# Patient Record
Sex: Male | Born: 1985 | Race: Black or African American | Hispanic: No | Marital: Single | Smoking: Current every day smoker
Health system: Southern US, Community
[De-identification: ages and names within clinical notes are randomized; demographics above are authoritative.]

---
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
## 2005-10-01 ENCOUNTER — Emergency Department (HOSPITAL_COMMUNITY): Admission: EM | Admit: 2005-10-01 | Discharge: 2005-10-02 | Payer: Self-pay | Admitting: Emergency Medicine

## 2005-10-02 ENCOUNTER — Emergency Department (HOSPITAL_COMMUNITY): Admission: EM | Admit: 2005-10-02 | Discharge: 2005-10-02 | Payer: Self-pay | Admitting: Emergency Medicine

## 2006-04-24 ENCOUNTER — Emergency Department (HOSPITAL_COMMUNITY): Admission: EM | Admit: 2006-04-24 | Discharge: 2006-04-25 | Payer: Self-pay | Admitting: Emergency Medicine

## 2013-05-28 ENCOUNTER — Other Ambulatory Visit: Payer: Self-pay | Admitting: Occupational Medicine

## 2013-05-28 ENCOUNTER — Ambulatory Visit: Payer: Self-pay

## 2013-05-28 DIAGNOSIS — R52 Pain, unspecified: Secondary | ICD-10-CM

## 2013-07-10 ENCOUNTER — Encounter (HOSPITAL_BASED_OUTPATIENT_CLINIC_OR_DEPARTMENT_OTHER): Payer: Self-pay | Admitting: *Deleted

## 2013-07-10 ENCOUNTER — Emergency Department (HOSPITAL_BASED_OUTPATIENT_CLINIC_OR_DEPARTMENT_OTHER)
Admission: EM | Admit: 2013-07-10 | Discharge: 2013-07-10 | Disposition: A | Discharge status: Home / Self Care | Payer: Self-pay | Attending: Emergency Medicine | Admitting: Emergency Medicine

## 2013-07-10 DIAGNOSIS — J069 Acute upper respiratory infection, unspecified: Secondary | ICD-10-CM | POA: Insufficient documentation

## 2013-07-10 DIAGNOSIS — F17200 Nicotine dependence, unspecified, uncomplicated: Secondary | ICD-10-CM | POA: Insufficient documentation

## 2013-07-10 MED ORDER — GUAIFENESIN-CODEINE 100-10 MG/5ML PO SOLN: 5.0000 mL | Freq: Three times a day (TID) | ORAL | Status: AC | PRN

## 2013-07-10 NOTE — ED Provider Notes (Signed)
CSN: [id number]     Arrival date & time 07/10/13  0026 History  This chart was scribed for Geoffery Lyons, MD by Quintella Reichert, ED scribe.  This patient was seen in room MH11/MH11 and the patient's care was started at 12:47 AM.   Chief Complaint  Patient presents with  . Nasal Congestion  . Cough    The history is provided by the patient. No language interpreter was used.    HPI Comments: Vincent Gross is a 27 y.o. male who presents to the Emergency Department complaining of 3 days of gradually-worsening mild congestion and rhinorrhea.  Pt states he recently came into contact with a friend with similar symptoms.  His symptoms began as rhinorrhea and nasal congestion and have progressed to include chest congestion.  He is also coughing up a small amount of sputum.  He has attempted to treat symptoms with Nyquil and Alka Seltzer Cold, without relief.  He denies fever, SOB, ear pain, or any other associated symptoms.  Pt is a current every-day smoker.    History reviewed. No pertinent past medical history.  History reviewed. No pertinent past surgical history.  No family history on file.   History  Substance Use Topics  . Smoking status: Current Every Day Smoker    Types: Cigarettes  . Smokeless tobacco: Never Used  . Alcohol Use: 2.5 oz/week    5 drink(s) per week     Review of Systems  HENT: Positive for congestion.   All other systems reviewed and are negative.     Allergies  Review of patient's allergies indicates no known allergies.  Home Medications  No current outpatient prescriptions on file.  BP 148/78  Pulse 85  Temp(Src) 98.5 F (36.9 C) (Oral)  Resp 16  Ht 5\' 10"  (1.778 m)  Wt 180 lb (81.647 kg)  BMI 25.83 kg/m2  SpO2 100%  Physical Exam  Nursing note and vitals reviewed. Constitutional: He is oriented to person, place, and time. He appears well-developed and well-nourished. No distress.  HENT:  Head: Normocephalic and atraumatic.   Mouth/Throat: Oropharynx is clear and moist. No oropharyngeal exudate.  Eyes: Conjunctivae are normal. Right eye exhibits no discharge. Left eye exhibits no discharge.  Neck: Neck supple. No tracheal deviation present.  Cardiovascular: Normal rate, regular rhythm and normal heart sounds.   No murmur heard. Pulmonary/Chest: Effort normal and breath sounds normal. No respiratory distress. He has no wheezes. He has no rales.  Musculoskeletal: Normal range of motion.  Neurological: He is alert and oriented to person, place, and time.  Skin: Skin is warm and dry.  Psychiatric: He has a normal mood and affect. His behavior is normal.    ED Course  Procedures (including critical care time)  DIAGNOSTIC STUDIES: Oxygen Saturation is 100% on room air, normal by my interpretation.    COORDINATION OF CARE: 12:52 AM: Informed pt that symptoms are likely due to self-limited viral infection.  Discussed treatment plan which includes symptomatic relief.  Pt expressed understanding and agreed to plan.   Labs Review Labs Reviewed - No data to display   Imaging Review No results found.  MDM  No diagnosis found. Patient is a 27 year old male presents with complaints of upper respiratory symptoms and one on for 2 days. His presentation and physical findings are consistent with a viral upper respiratory infection. He is a virus that over-the-counter medications, rest, plenty fluids, time are the treatment for this. He was given a prescription for Robitussin with codeine as  he states he has been coughing at night. He is to return if you develop shortness of breath or other problems    I personally performed the services described in this documentation, which was scribed in my presence. The recorded information has been reviewed and is accurate.      Geoffery Lyons, MD 07/10/13 [phone]

## 2013-07-10 NOTE — ED Notes (Signed)
Pt reports chest congestion and runny nose since Saturday- taking nyquil, alkaseltzer cold
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
## 2014-04-26 ENCOUNTER — Emergency Department (HOSPITAL_BASED_OUTPATIENT_CLINIC_OR_DEPARTMENT_OTHER)
Admission: EM | Admit: 2014-04-26 | Discharge: 2014-04-26 | Disposition: A | Discharge status: Home / Self Care | Payer: Self-pay | Attending: Emergency Medicine | Admitting: Emergency Medicine

## 2014-04-26 ENCOUNTER — Encounter (HOSPITAL_BASED_OUTPATIENT_CLINIC_OR_DEPARTMENT_OTHER): Payer: Self-pay | Admitting: Emergency Medicine

## 2014-04-26 DIAGNOSIS — Z79899 Other long term (current) drug therapy: Secondary | ICD-10-CM | POA: Insufficient documentation

## 2014-04-26 DIAGNOSIS — F17200 Nicotine dependence, unspecified, uncomplicated: Secondary | ICD-10-CM | POA: Insufficient documentation

## 2014-04-26 DIAGNOSIS — B86 Scabies: Secondary | ICD-10-CM | POA: Insufficient documentation

## 2014-04-26 MED ORDER — PERMETHRIN 5 % EX CREA: TOPICAL_CREAM | CUTANEOUS | Status: AC

## 2014-04-26 NOTE — ED Provider Notes (Signed)
Medical screening examination/treatment/procedure(s) were performed by non-physician practitioner and as supervising physician I was immediately available for consultation/collaboration.   EKG Interpretation None        Christopher J. Pollina, MD 04/26/14 2106 

## 2014-04-26 NOTE — Discharge Instructions (Signed)

## 2014-04-26 NOTE — ED Provider Notes (Signed)
CSN: [id number]     Arrival date & time 04/26/14  1640 History   First MD Initiated Contact with Patient 04/26/14 1643     Chief Complaint  Patient presents with  . Rash     (Consider location/radiation/quality/duration/timing/severity/associated sxs/prior Treatment) Patient is a 28 y.o. male presenting with rash. The history is provided by the patient. No language interpreter was used.  Rash Location:  Hand Associated symptoms: no fever   Associated symptoms comment:  Itching rash affecting web spaces of fingers and dorsum of hands bilaterally. He reports multiple family members affected and have been treated for scabies.   History reviewed. No pertinent past medical history. History reviewed. No pertinent past surgical history. No family history on file. History  Substance Use Topics  . Smoking status: Current Every Day Smoker    Types: Cigarettes  . Smokeless tobacco: Never Used  . Alcohol Use: 2.5 oz/week    5 drink(s) per week    Review of Systems  Constitutional: Negative for fever.  Skin: Positive for rash.      Allergies  Review of patient's allergies indicates no known allergies.  Home Medications   Prior to Admission medications   Medication Sig Start Date End Date Taking? Authorizing Provider  guaiFENesin-codeine 100-10 MG/5ML syrup Take 5 mLs by mouth 3 (three) times daily as needed for cough. 07/10/13   Geoffery Lyonsouglas Delo, MD   BP 136/77  Pulse 92  Temp(Src) 98.4 F (36.9 C) (Oral)  Resp 20  Wt 187 lb (84.823 kg)  SpO2 99% Physical Exam  Skin:  Maculopapular rash of interphalangeal spaces bilateral hands, extending to dorsum c/w scabies infection.    ED Course  Procedures (including critical care time) Labs Review Labs Reviewed - No data to display  Imaging Review No results found.   EKG Interpretation None      MDM   Final diagnoses:  None    1. Scabies  Permethrin given and instruction including home cleaning.    Arnoldo HookerShari A  Josseline Reddin, PA-C 04/26/14 1711

## 2014-04-26 NOTE — ED Notes (Signed)
Rash for 3 weeks. He has had scabies exposure.

## 2021-08-06 ENCOUNTER — Ambulatory Visit (HOSPITAL_COMMUNITY): Admission: EM | Admit: 2021-08-06 | Discharge: 2021-08-06 | Discharge status: Against Medical Advice | Payer: Self-pay

## 2021-08-06 ENCOUNTER — Other Ambulatory Visit: Payer: Self-pay

## 2021-08-06 DIAGNOSIS — Z5321 Procedure and treatment not carried out due to patient leaving prior to being seen by health care provider: Secondary | ICD-10-CM

## 2021-08-06 NOTE — ED Provider Notes (Signed)
[facility]   Rhys Martini, PA-C 08/06/21 1634

## 2021-10-27 ENCOUNTER — Other Ambulatory Visit: Payer: Self-pay

## 2021-10-27 ENCOUNTER — Encounter (HOSPITAL_COMMUNITY): Payer: Self-pay

## 2021-10-27 ENCOUNTER — Ambulatory Visit (HOSPITAL_COMMUNITY)
Admission: EM | Admit: 2021-10-27 | Discharge: 2021-10-27 | Disposition: A | Discharge status: Home / Self Care | Payer: Self-pay | Attending: Urgent Care | Admitting: Urgent Care

## 2021-10-27 DIAGNOSIS — R1310 Dysphagia, unspecified: Secondary | ICD-10-CM | POA: Insufficient documentation

## 2021-10-27 DIAGNOSIS — Z20818 Contact with and (suspected) exposure to other bacterial communicable diseases: Secondary | ICD-10-CM | POA: Insufficient documentation

## 2021-10-27 DIAGNOSIS — J029 Acute pharyngitis, unspecified: Secondary | ICD-10-CM | POA: Insufficient documentation

## 2021-10-27 LAB — POCT RAPID STREP A, ED / UC: Streptococcus, Group A Screen (Direct): NEGATIVE

## 2021-10-27 MED ORDER — AMOXICILLIN 500 MG PO CAPS: 500.0000 mg | ORAL_CAPSULE | Freq: Two times a day (BID) | ORAL | 0 refills | Status: AC

## 2021-10-27 NOTE — ED Triage Notes (Signed)
Pt presents with sore throat X 2 days. 

## 2021-10-27 NOTE — ED Provider Notes (Signed)
[facility]   MRN: [id number] DOB: 13-May-1986  Subjective:   Vincent Gross is a 36 y.o. male presenting for 2-day history of acute onset persistent and worsening throat pain, fevers, painful swallowing, bad taste in his mouth.  He has had multiple exposures including to his kids and his wife with strep throat.  They tested positive for for this infection and are now on antibiotics.  He has been doing supportive care at home but is not helping.  Denies runny or stuffy nose, cough, chest pain, shortness of breath.  Patient is a smoker.  No current facility-administered medications for this encounter.  Current Outpatient Medications:    guaiFENesin-codeine 100-10 MG/5ML syrup, Take 5 mLs by mouth 3 (three) times daily as needed for cough., Disp: 120 mL, Rfl: 0   permethrin (ELIMITE) 5 % cream, Apply from neck down to affected area at night x 1 - then wash off in the morning., Disp: 60 g, Rfl: 0   No Known Allergies  History reviewed. No pertinent past medical history.   History reviewed. No pertinent surgical history.  Family History  Family history unknown: Yes    Social History   Tobacco Use   Smoking status: Every Day    Types: Cigarettes   Smokeless tobacco: Never  Substance Use Topics   Alcohol use: Yes    Alcohol/week: 5.0 standard drinks    Types: 5 drink(s) per week   Drug use: No    ROS   Objective:   Vitals: BP 128/74 (BP Location: Left Arm)    Pulse 98    Temp 98.4 F (36.9 C) (Oral)    Resp 18    SpO2 98%   Physical Exam Constitutional:      General: He is not in acute distress.    Appearance: Normal appearance. He is well-developed and normal weight. He is not ill-appearing, toxic-appearing or diaphoretic.  HENT:     Head: Normocephalic and atraumatic.     Right Ear: External ear normal.     Left Ear: External ear normal.     Nose: Nose normal.     Mouth/Throat:     Pharynx: Pharyngeal swelling and posterior oropharyngeal  erythema present. No oropharyngeal exudate or uvula swelling.     Tonsils: No tonsillar exudate or tonsillar abscesses. 0 on the right. 0 on the left.  Eyes:     General: No scleral icterus.       Right eye: No discharge.        Left eye: No discharge.     Extraocular Movements: Extraocular movements intact.  Cardiovascular:     Rate and Rhythm: Normal rate.  Pulmonary:     Effort: Pulmonary effort is normal.  Musculoskeletal:     Cervical back: Normal range of motion.  Neurological:     Mental Status: He is alert and oriented to person, place, and time.  Psychiatric:        Mood and Affect: Mood normal.        Behavior: Behavior normal.        Thought Content: Thought content normal.        Judgment: Judgment normal.    Assessment and Plan :   PDMP not reviewed this encounter.  1. Pharyngitis, unspecified etiology   2. Sore throat   3. Painful swallowing   4. Strep throat exposure     Will treat empirically for pharyngitis given physical exam findings and multiple exposures at home.  Patient  is to start amoxicillin, use supportive care otherwise. Counseled patient on potential for adverse effects with medications prescribed/recommended today, ER and return-to-clinic precautions discussed, patient verbalized understanding.    Wallis Bamberg, [place] 10/27/21 1814

## 2021-10-30 LAB — CULTURE, GROUP A STREP (THRC)

## 2022-09-09 ENCOUNTER — Encounter (HOSPITAL_COMMUNITY): Payer: Self-pay

## 2022-09-09 ENCOUNTER — Other Ambulatory Visit: Payer: Self-pay

## 2022-09-09 ENCOUNTER — Emergency Department (HOSPITAL_COMMUNITY)
Admission: EM | Admit: 2022-09-09 | Discharge: 2022-09-09 | Disposition: A | Discharge status: Home / Self Care | Payer: Self-pay | Attending: Emergency Medicine | Admitting: Emergency Medicine

## 2022-09-09 DIAGNOSIS — R42 Dizziness and giddiness: Secondary | ICD-10-CM | POA: Insufficient documentation

## 2022-09-09 DIAGNOSIS — Z1152 Encounter for screening for COVID-19: Secondary | ICD-10-CM | POA: Insufficient documentation

## 2022-09-09 DIAGNOSIS — B349 Viral infection, unspecified: Secondary | ICD-10-CM | POA: Insufficient documentation

## 2022-09-09 MED ORDER — CETIRIZINE HCL 10 MG PO TABS: 10.0000 mg | ORAL_TABLET | Freq: Every day | ORAL | 0 refills | Status: AC

## 2022-09-09 MED ORDER — FLUTICASONE PROPIONATE 50 MCG/ACT NA SUSP: 2.0000 | Freq: Every day | NASAL | 0 refills | Status: AC

## 2022-09-09 NOTE — ED Notes (Signed)
Patient verbalizes understanding of discharge instructions. Opportunity for questioning and answers were provided. Armband removed by staff, pt discharged from ED ambulatory.   

## 2022-09-09 NOTE — Discharge Instructions (Signed)
Viral Illness TREATMENT  Treatment is directed at relieving symptoms. There is no cure. Antibiotics are not effective, because the infection is caused by a virus, not by bacteria. Treatment may include:  Increased fluid intake. Sports drinks offer valuable electrolytes, sugars, and fluids.  Breathing heated mist or steam (vaporizer or shower).  Eating chicken soup or other clear broths, and maintaining good nutrition.  Getting plenty of rest.  Using gargles or lozenges for comfort.  Increasing usage of your inhaler if you have asthma.  Return to work when your temperature has returned to normal.  Gargle warm salt water and spit it out for sore throat. Take benadryl to decrease sinus secretions. Continue to alternate between Tylenol and ibuprofen for pain and fever control.  Follow Up: Follow up with your primary care doctor in 5-7 days for recheck of ongoing symptoms.  Return to emergency department for emergent changing or worsening of symptoms.  

## 2022-09-09 NOTE — ED Triage Notes (Signed)
Pt reports having flu symptoms x1 day. Pt reports feeling groggy, hot feeling over body, aches, and chills.  Pt wife and child diagnosed with flu 2 days ago.

## 2022-09-09 NOTE — ED Provider Notes (Signed)
[facility] EMERGENCY DEPARTMENT Provider Note   CSN: [id number] Arrival date & time: 09/09/22  2029     History  Chief Complaint  Patient presents with   Flu Symptoms    Vincent Gross is a 36 y.o. male.  HPI Patient is a 36 year old male with no pertinent past medical history presented emergency room today with complaints of cough congestion fatigue lightheadedness generalized weakness and myalgias.  He states he has not taken any medications apart from some NyQuil earlier this morning.  He states that his wife and child have influenza.  He denies any fevers that he knows of.  No chest pain difficulty breathing.  No other associated symptoms.    Home Medications Prior to Admission medications   Medication Sig Start Date End Date Taking? Authorizing Provider  amoxicillin (AMOXIL) 500 MG capsule Take 1 capsule (500 mg total) by mouth 2 (two) times daily. 10/27/21   Wallis Bamberg, PA-C  guaiFENesin-codeine 100-10 MG/5ML syrup Take 5 mLs by mouth 3 (three) times daily as needed for cough. 07/10/13   Geoffery Lyons, MD  permethrin (ELIMITE) 5 % cream Apply from neck down to affected area at night x 1 - then wash off in the morning. 04/26/14   Elpidio Anis, PA-C      Allergies    Patient has no known allergies.    Review of Systems   Review of Systems  Physical Exam Updated Vital Signs BP (!) 153/92 (BP Location: Right Arm)   Pulse 92   Temp 98 F (36.7 C) (Oral)   Resp 18   Ht 5\' 10"  (1.778 m)   Wt 99.8 kg   SpO2 100%   BMI 31.57 kg/m  Physical Exam Vitals and nursing note reviewed.  Constitutional:      General: He is not in acute distress.    Comments: Pleasant well-appearing 36 year old.  In no acute distress. Able answer questions appropriately follow commands. No increased work of breathing. Speaking in full sentences.   HENT:     Head: Normocephalic and atraumatic.     Nose: Nose normal.  Eyes:     General: No scleral  icterus. Cardiovascular:     Rate and Rhythm: Normal rate and regular rhythm.     Pulses: Normal pulses.     Heart sounds: Normal heart sounds.  Pulmonary:     Effort: Pulmonary effort is normal. No respiratory distress.     Breath sounds: No wheezing.  Abdominal:     Palpations: Abdomen is soft.     Tenderness: There is no abdominal tenderness.  Musculoskeletal:     Cervical back: Normal range of motion.     Right lower leg: No edema.     Left lower leg: No edema.  Skin:    General: Skin is warm and dry.     Capillary Refill: Capillary refill takes less than 2 seconds.  Neurological:     Mental Status: He is alert. Mental status is at baseline.  Psychiatric:        Mood and Affect: Mood normal.        Behavior: Behavior normal.     ED Results / Procedures / Treatments   Labs (all labs ordered are listed, but only abnormal results are displayed) Labs Reviewed  RESP PANEL BY RT-PCR (FLU A&B, COVID) [id number]    EKG None  Radiology No results found.  Procedures Procedures    Medications Ordered in ED Medications - No data to display  ED Course/  Medical Decision Making/ A&P                           Medical Decision Making   Patient is a 36 year old male with no pertinent past medical history presented emergency room today with complaints of cough congestion fatigue lightheadedness generalized weakness and myalgias.  He states he has not taken any medications apart from some NyQuil earlier this morning.  He states that his wife and child have influenza.  He denies any fevers that he knows of.  No chest pain difficulty breathing.  No other associated symptoms.  Physical exam unremarkable lungs are clear no wheezing or crackles no lower extremity edema.  No abdominal tenderness  COVID influenza PCR test obtained.  Patient is afebrile with normal vital signs apart from very mild hypertension.  Recommend PCP follow-up.  Conservative therapy recommendations made.   Return precautions discussed.  Will discharge home at this time    Final Clinical Impression(s) / ED Diagnoses Final diagnoses:  Viral illness    Rx / DC Orders ED Discharge Orders     None         Gailen Shelter, [place] 09/09/22 2200    Terrilee Files, MD 09/10/22 1126

## 2022-09-10 LAB — RESP PANEL BY RT-PCR (FLU A&B, COVID) ARPGX2
Influenza A by PCR: NEGATIVE
Influenza B by PCR: NEGATIVE
SARS Coronavirus 2 by RT PCR: NEGATIVE

## 2022-09-20 ENCOUNTER — Emergency Department (HOSPITAL_COMMUNITY)
Admission: EM | Admit: 2022-09-20 | Discharge: 2022-09-20 | Disposition: A | Discharge status: Home / Self Care | Payer: Self-pay | Attending: Emergency Medicine | Admitting: Emergency Medicine

## 2022-09-20 ENCOUNTER — Encounter (HOSPITAL_COMMUNITY): Payer: Self-pay | Admitting: Emergency Medicine

## 2022-09-20 DIAGNOSIS — R051 Acute cough: Secondary | ICD-10-CM | POA: Insufficient documentation

## 2022-09-20 MED ORDER — ALBUTEROL SULFATE HFA 108 (90 BASE) MCG/ACT IN AERS
2.0000 | INHALATION_SPRAY | Freq: Once | RESPIRATORY_TRACT | Status: AC
  Administered 2022-09-20: 2 via RESPIRATORY_TRACT
  Filled 2022-09-20: qty 6.7

## 2022-09-20 NOTE — ED Provider Notes (Signed)
[facility] EMERGENCY DEPARTMENT Provider Note   CSN: [id number] Arrival date & time: 09/20/22  2258     History  Chief Complaint  Patient presents with   Cough    Vincent Gross is a 36 y.o. male who presents with concern for persisting cough a week and a half after acute viral illness, his family had influenza.  States that he needs a work note to be cleared to return to work.  Has been fever free for greater than 72 hours, cough is improving, patient with improvement in overall symptoms.  No longer with bodyaches fevers chills nausea vomiting or diarrhea.  Dry cough, no shortness of breath, no chest pain.  I personally reviewed medical records previous acute medical diagnoses and is not on medications daily.  HPI     Home Medications Prior to Admission medications   Medication Sig Start Date End Date Taking? Authorizing Provider  amoxicillin (AMOXIL) 500 MG capsule Take 1 capsule (500 mg total) by mouth 2 (two) times daily. Patient not taking: Reported on 09/09/2022 10/27/21   Wallis Bamberg, PA-C  cetirizine (ZYRTEC ALLERGY) 10 MG tablet Take 1 tablet (10 mg total) by mouth daily. 09/09/22   Fondaw, Rodrigo Ran, PA  fluticasone (FLONASE) 50 MCG/ACT nasal spray Place 2 sprays into both nostrils daily for 14 days. 09/09/22 09/23/22  Gailen Shelter, PA  guaiFENesin-codeine 100-10 MG/5ML syrup Take 5 mLs by mouth 3 (three) times daily as needed for cough. Patient not taking: Reported on 09/09/2022 07/10/13   Geoffery Lyons, MD  permethrin (ELIMITE) 5 % cream Apply from neck down to affected area at night x 1 - then wash off in the morning. Patient not taking: Reported on 09/09/2022 04/26/14   Elpidio Anis, PA-C      Allergies    Patient has no known allergies.    Review of Systems   Review of Systems  Respiratory:  Positive for cough. Negative for chest tightness and shortness of breath.   Cardiovascular: Negative.   Gastrointestinal: Negative.     Physical  Exam Updated Vital Signs BP (!) 142/89 (BP Location: Right Arm)   Pulse 99   Temp 98.2 F (36.8 C) (Oral)   Resp 18   SpO2 97%  Physical Exam Vitals and nursing note reviewed.  Constitutional:      Appearance: He is not ill-appearing or toxic-appearing.  HENT:     Head: Normocephalic and atraumatic.  Eyes:     General: No scleral icterus.       Right eye: No discharge.        Left eye: No discharge.     Extraocular Movements: Extraocular movements intact.     Conjunctiva/sclera: Conjunctivae normal.     Pupils: Pupils are equal, round, and reactive to light.  Cardiovascular:     Rate and Rhythm: Normal rate.     Heart sounds: Normal heart sounds. No murmur heard. Pulmonary:     Effort: Pulmonary effort is normal. No tachypnea, bradypnea, accessory muscle usage, prolonged expiration, respiratory distress or retractions.     Breath sounds: Normal breath sounds.     Comments: Dry cough throughout exam Abdominal:     Palpations: Abdomen is soft.  Skin:    General: Skin is warm and dry.  Neurological:     General: No focal deficit present.     Mental Status: He is alert.  Psychiatric:        Mood and Affect: Mood normal.     ED  Results / Procedures / Treatments   Labs (all labs ordered are listed, but only abnormal results are displayed) Labs Reviewed - No data to display  EKG None  Radiology No results found.  Procedures Procedures    Medications Ordered in ED Medications  albuterol (VENTOLIN HFA) 108 (90 Base) MCG/ACT inhaler 2 puff (2 puffs Inhalation Given 09/20/22 2350)    ED Course/ Medical Decision Making/ A&P                           Medical Decision Making 36 year old male presents with persistent dry cough after viral illness approximately 2 weeks ago.  Mildly hypertensive on intake and vital signs otherwise normal.  Cardiopulmonary exam is unremarkable, abdominal exam is benign.  Patient is very well-appearing.  Clinical picture most  consistent with postviral persistent cough,, however given patient's symptoms are improving and cardiopulmonary exam is reassuring, do not feel imaging is warranted at this time.  Patient offered repeat viral swab, declined.  Risk Prescription drug management.   At this point patient has been fever free for greater than 72 hours and is improving to symptomatology.  Feel he is safe to return to work as he may have some persistent postviral cough for the next several days.  Work note provided.  Albuterol inhaler provided for symptom management as well.  No further workup warranted in the ER at this time.  Clinical concern for emergent underlying etiology that warrant further ED workup or inpatient management exceedingly low.  [name] voiced understanding of his medical evaluation and treatment plan. Each of their questions answered to their expressed satisfaction.  Return precautions were given.  Patient is well-appearing, stable, and was discharged in good condition.  This chart was dictated using voice recognition software, Dragon. Despite the best efforts of this provider to proofread and correct errors, errors may still occur which can change documentation meaning.   Final Clinical Impression(s) / ED Diagnoses Final diagnoses:  Acute cough    Rx / DC Orders ED Discharge Orders     None         Paris Lore, PA-C 09/20/22 2354    Sloan Leiter, DO 09/22/22 [phone]

## 2022-09-20 NOTE — Discharge Instructions (Addendum)
You are seen in the ER today for your cough.  Your physical exam and vital signs were reassuring.  Viral illnesses such as you had a few weeks ago can have a lingering cough for several weeks after.  You may use the provided albuterol inhaler with 2 puffs every 4 hours as needed.  You are cleared to go back to work.  Return to the ER with any new severe symptoms.

## 2022-09-20 NOTE — ED Triage Notes (Signed)
Needs work clearance to return to after persistent cough. Family had flu. No fevers and pt's symptoms improving daily.

## 2023-03-03 ENCOUNTER — Emergency Department (HOSPITAL_COMMUNITY)
Admission: EM | Admit: 2023-03-03 | Discharge: 2023-03-03 | Disposition: A | Discharge status: Home / Self Care | Payer: Self-pay | Attending: Emergency Medicine | Admitting: Emergency Medicine

## 2023-03-03 ENCOUNTER — Emergency Department (HOSPITAL_COMMUNITY): Payer: Self-pay

## 2023-03-03 ENCOUNTER — Other Ambulatory Visit: Payer: Self-pay

## 2023-03-03 ENCOUNTER — Encounter (HOSPITAL_COMMUNITY): Payer: Self-pay | Admitting: Emergency Medicine

## 2023-03-03 DIAGNOSIS — Y92410 Unspecified street and highway as the place of occurrence of the external cause: Secondary | ICD-10-CM | POA: Diagnosis not present

## 2023-03-03 DIAGNOSIS — M25561 Pain in right knee: Secondary | ICD-10-CM | POA: Insufficient documentation
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
## 2023-03-03 NOTE — Discharge Instructions (Addendum)
Follow-up with your primary care doctor, make sure you are elevating your leg, and icing it.  You can use Tylenol and ibuprofen for pain control.  The good news is your x-ray is negative and there is no fractures.  If you do not have a pulse in your leg, your leg becomes very cold, please return to the ED.
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
